# Patient Record
Sex: Female | Born: 1985
Health system: Southern US, Community
[De-identification: ages and names within clinical notes are randomized; demographics above are authoritative.]

## PROBLEM LIST (undated history)

## (undated) DIAGNOSIS — E669 Obesity, unspecified: Secondary | ICD-10-CM

## (undated) DIAGNOSIS — R062 Wheezing: Secondary | ICD-10-CM

## (undated) DIAGNOSIS — T7840XA Allergy, unspecified, initial encounter: Secondary | ICD-10-CM

## (undated) DIAGNOSIS — Z8489 Family history of other specified conditions: Secondary | ICD-10-CM

## (undated) DIAGNOSIS — R0602 Shortness of breath: Secondary | ICD-10-CM

## (undated) DIAGNOSIS — A6000 Herpesviral infection of urogenital system, unspecified: Secondary | ICD-10-CM

## (undated) HISTORY — PX: NO PAST SURGERIES: SHX2092

## (undated) HISTORY — DX: Allergy, unspecified, initial encounter: T78.40XA

---

## 2012-05-01 ENCOUNTER — Ambulatory Visit (INDEPENDENT_AMBULATORY_CARE_PROVIDER_SITE_OTHER): Payer: 59 | Admitting: Emergency Medicine

## 2012-05-01 VITALS — BP 118/88 | HR 86 | Temp 98.6°F | Resp 16 | Ht 63.0 in | Wt 318.2 lb

## 2012-05-01 DIAGNOSIS — J309 Allergic rhinitis, unspecified: Secondary | ICD-10-CM | POA: Insufficient documentation

## 2012-05-01 MED ORDER — MOMETASONE FUROATE 50 MCG/ACT NA SUSP: 4.0000 | Freq: Every day | NASAL | Status: DC

## 2012-05-01 MED ORDER — FEXOFENADINE HCL 180 MG PO TABS: 180.0000 mg | ORAL_TABLET | Freq: Every day | ORAL | Status: DC

## 2012-05-01 MED ORDER — MONTELUKAST SODIUM 10 MG PO TABS: 10.0000 mg | ORAL_TABLET | Freq: Every day | ORAL | Status: DC

## 2012-05-01 MED ORDER — ALBUTEROL SULFATE HFA 108 (90 BASE) MCG/ACT IN AERS: 2.0000 | INHALATION_SPRAY | RESPIRATORY_TRACT | Status: DC | PRN

## 2012-05-01 NOTE — Patient Instructions (Addendum)
Allergic Rhinitis  Allergic rhinitis is when the mucous membranes in the nose respond to allergens. Allergens are particles in the air that cause your body to have an allergic reaction. This causes you to release allergic antibodies. Through a chain of events, these eventually cause you to release histamine into the blood stream (hence the use of antihistamines). Although meant to be protective to the body, it is this release that causes your discomfort, such as frequent sneezing, congestion and an itchy runny nose.    CAUSES    The pollen allergens may come from grasses, trees, and weeds. This is seasonal allergic rhinitis, or "hay fever." Other allergens cause year-round allergic rhinitis (perennial allergic rhinitis) such as house dust mite allergen, pet dander and mold spores.    SYMPTOMS     Nasal stuffiness (congestion).   Runny, itchy nose with sneezing and tearing of the eyes.   There is often an itching of the mouth, eyes and ears.  It cannot be cured, but it can be controlled with medications.  DIAGNOSIS    If you are unable to determine the offending allergen, skin or blood testing may find it.  TREATMENT     Avoid the allergen.   Medications and allergy shots (immunotherapy) can help.   Hay fever may often be treated with antihistamines in pill or nasal spray forms. Antihistamines block the effects of histamine. There are over-the-counter medicines that may help with nasal congestion and swelling around the eyes. Check with your caregiver before taking or giving this medicine.  If the treatment above does not work, there are many new medications your caregiver can prescribe. Stronger medications may be used if initial measures are ineffective. Desensitizing injections can be used if medications and avoidance fails. Desensitization is when a patient is given ongoing shots until the body becomes less sensitive to the allergen. Make sure you follow up with your caregiver if problems continue.   SEEK MEDICAL CARE IF:     You develop fever (more than 100.5 F (38.1 C).   You develop a cough that does not stop easily (persistent).   You have shortness of breath.   You start wheezing.   Symptoms interfere with normal daily activities.  Document Released: 09/26/2000 Document Revised: 03/26/2011 Document Reviewed: 04/07/2008  ExitCare Patient Information 2013 [employer].

## 2012-05-01 NOTE — Progress Notes (Signed)
[facility] [address], [place] [address] [phone]  Date:  05/01/2012   Name:  Erica Skinner   DOB:  23-Sep-1985   MRN:  [id number]  PCP:  No PCP Per Patient    Chief Complaint: Allergies and Medication Refill   History of Present Illness:  Erica Skinner is a 27 y.o. very pleasant female patient who presents with the following:  History of seasonal allergic rhinitis.  Is experiencing sever nasal and eye allergy symptoms this year.  Denies fever or chills, only intermittent cough with scant wheezing.  Nasal drainage is clear in color.  No improvement with over the counter medications or other home remedies. Denies other complaint or health concern today.   Patient Active Problem List  Diagnosis  . Allergic rhinitis, cause unspecified    Past Medical History  Diagnosis Date  . Allergy     No past surgical history on file.  History  Substance Use Topics  . Smoking status: Never Smoker   . Smokeless tobacco: Not on file  . Alcohol Use: Yes     Comment: socially    No family history on file.  No Known Allergies  Medication list has been reviewed and updated.  No current outpatient prescriptions on file prior to visit.   No current facility-administered medications on file prior to visit.    Review of Systems:  As per HPI, otherwise negative.    Physical Examination: Filed Vitals:   05/01/12 1028  BP: 118/88  Pulse: 86  Temp: 98.6 F (37 C)  Resp: 16   Filed Vitals:   05/01/12 1028  Height: 5\' 3"  (1.6 m)  Weight: 318 lb 3.2 oz (144.335 kg)   Body mass index is 56.38 kg/(m^2). Ideal Body Weight: Weight in (lb) to have BMI = 25: 140.8  GEN: WDWN, NAD, Non-toxic, A & O x 3 HEENT: Atraumatic, Normocephalic. Neck supple. No masses, No LAD. Ears and Nose: No external deformity. CV: RRR, No M/G/R. No JVD. No thrill. No extra heart sounds. PULM: CTA B, no wheezes, crackles, rhonchi. No retractions. No resp. distress.  No accessory muscle use. ABD: S, NT, ND, +BS. No rebound. No HSM. EXTR: No c/c/e NEURO Normal gait.  PSYCH: Normally interactive. Conversant. Not depressed or anxious appearing.  Calm demeanor.    Assessment and Plan: Seasonal allergic rhinitis Allergic bronchitis Allegra nasonex Albuterol singulair  Follow up as needed   Signed,  Phillips Odor, MD

## 2012-05-02 NOTE — Progress Notes (Signed)
Reviewed and agree.

## 2012-12-25 ENCOUNTER — Other Ambulatory Visit (HOSPITAL_COMMUNITY)
Admission: RE | Admit: 2012-12-25 | Discharge: 2012-12-25 | Disposition: A | Discharge status: Home / Self Care | Payer: 59 | Source: Ambulatory Visit | Attending: Family Medicine | Admitting: Family Medicine

## 2012-12-25 DIAGNOSIS — Z0000 Encounter for general adult medical examination without abnormal findings: Secondary | ICD-10-CM | POA: Insufficient documentation

## 2012-12-25 DIAGNOSIS — R87810 Cervical high risk human papillomavirus (HPV) DNA test positive: Secondary | ICD-10-CM | POA: Insufficient documentation

## 2012-12-25 DIAGNOSIS — Z1151 Encounter for screening for human papillomavirus (HPV): Secondary | ICD-10-CM | POA: Insufficient documentation

## 2013-08-22 ENCOUNTER — Emergency Department (HOSPITAL_COMMUNITY): Payer: 59

## 2013-08-22 ENCOUNTER — Emergency Department (HOSPITAL_COMMUNITY)
Admission: EM | Admit: 2013-08-22 | Discharge: 2013-08-22 | Disposition: A | Discharge status: Home / Self Care | Payer: 59 | Attending: Emergency Medicine | Admitting: Emergency Medicine

## 2013-08-22 ENCOUNTER — Encounter (HOSPITAL_COMMUNITY): Payer: Self-pay | Admitting: Emergency Medicine

## 2013-08-22 DIAGNOSIS — R000 Tachycardia, unspecified: Secondary | ICD-10-CM | POA: Insufficient documentation

## 2013-08-22 DIAGNOSIS — E669 Obesity, unspecified: Secondary | ICD-10-CM | POA: Insufficient documentation

## 2013-08-22 DIAGNOSIS — K5900 Constipation, unspecified: Secondary | ICD-10-CM | POA: Insufficient documentation

## 2013-08-22 DIAGNOSIS — K8050 Calculus of bile duct without cholangitis or cholecystitis without obstruction: Secondary | ICD-10-CM

## 2013-08-22 DIAGNOSIS — R1011 Right upper quadrant pain: Secondary | ICD-10-CM | POA: Diagnosis present

## 2013-08-22 DIAGNOSIS — Z3202 Encounter for pregnancy test, result negative: Secondary | ICD-10-CM | POA: Insufficient documentation

## 2013-08-22 DIAGNOSIS — K8020 Calculus of gallbladder without cholecystitis without obstruction: Secondary | ICD-10-CM | POA: Diagnosis not present

## 2013-08-22 HISTORY — DX: Obesity, unspecified: E66.9

## 2013-08-22 LAB — CBC WITH DIFFERENTIAL/PLATELET
BASOS ABS: 0 10*3/uL (ref 0.0–0.1)
Basophils Relative: 0 % (ref 0–1)
Eosinophils Absolute: 0 10*3/uL (ref 0.0–0.7)
Eosinophils Relative: 0 % (ref 0–5)
HCT: 35.3 % — ABNORMAL LOW (ref 36.0–46.0)
HEMOGLOBIN: 11.8 g/dL — AB (ref 12.0–15.0)
Lymphocytes Relative: 18 % (ref 12–46)
Lymphs Abs: 2.8 10*3/uL (ref 0.7–4.0)
MCH: 28.9 pg (ref 26.0–34.0)
MCHC: 33.4 g/dL (ref 30.0–36.0)
MCV: 86.5 fL (ref 78.0–100.0)
MONOS PCT: 7 % (ref 3–12)
Monocytes Absolute: 1 10*3/uL (ref 0.1–1.0)
NEUTROS ABS: 11.6 10*3/uL — AB (ref 1.7–7.7)
NEUTROS PCT: 75 % (ref 43–77)
Platelets: 446 10*3/uL — ABNORMAL HIGH (ref 150–400)
RBC: 4.08 MIL/uL (ref 3.87–5.11)
RDW: 12.7 % (ref 11.5–15.5)
WBC: 15.4 10*3/uL — AB (ref 4.0–10.5)

## 2013-08-22 LAB — URINALYSIS, ROUTINE W REFLEX MICROSCOPIC
Bilirubin Urine: NEGATIVE
Glucose, UA: NEGATIVE mg/dL
Ketones, ur: NEGATIVE mg/dL
NITRITE: NEGATIVE
PH: 7.5 (ref 5.0–8.0)
Protein, ur: 30 mg/dL — AB
Specific Gravity, Urine: 1.019 (ref 1.005–1.030)
Urobilinogen, UA: 0.2 mg/dL (ref 0.0–1.0)

## 2013-08-22 LAB — COMPREHENSIVE METABOLIC PANEL
ALK PHOS: 122 U/L — AB (ref 39–117)
ALT: 12 U/L (ref 0–35)
AST: 15 U/L (ref 0–37)
Albumin: 3.6 g/dL (ref 3.5–5.2)
Anion gap: 14 (ref 5–15)
BILIRUBIN TOTAL: 0.5 mg/dL (ref 0.3–1.2)
BUN: 6 mg/dL (ref 6–23)
CHLORIDE: 99 meq/L (ref 96–112)
CO2: 25 mEq/L (ref 19–32)
Calcium: 9.3 mg/dL (ref 8.4–10.5)
Creatinine, Ser: 0.57 mg/dL (ref 0.50–1.10)
GFR calc Af Amer: >90 mL/min (ref >90)
GFR calc non Af Amer: >90 mL/min (ref >90)
Glucose, Bld: 117 mg/dL — ABNORMAL HIGH (ref 70–99)
POTASSIUM: 3.6 meq/L — AB (ref 3.7–5.3)
Sodium: 138 mEq/L (ref 137–147)
Total Protein: 7.7 g/dL (ref 6.0–8.3)

## 2013-08-22 LAB — I-STAT TROPONIN, ED: Troponin i, poc: 0 ng/mL (ref 0.00–0.08)

## 2013-08-22 LAB — LIPASE, BLOOD: Lipase: 21 U/L (ref 11–59)

## 2013-08-22 LAB — URINE MICROSCOPIC-ADD ON

## 2013-08-22 LAB — PREGNANCY, URINE: Preg Test, Ur: NEGATIVE

## 2013-08-22 MED ORDER — HYDROMORPHONE HCL PF 1 MG/ML IJ SOLN
0.5000 mg | Freq: Once | INTRAMUSCULAR | Status: AC
  Administered 2013-08-22: 0.5 mg via INTRAMUSCULAR

## 2013-08-22 MED ORDER — OXYCODONE-ACETAMINOPHEN 5-325 MG PO TABS: 1.0000 | ORAL_TABLET | Freq: Four times a day (QID) | ORAL | Status: AC | PRN

## 2013-08-22 MED ORDER — SODIUM CHLORIDE 0.9 % IV BOLUS (SEPSIS): 1000.0000 mL | INTRAVENOUS | Status: DC

## 2013-08-22 MED ORDER — OXYCODONE-ACETAMINOPHEN 5-325 MG PO TABS
1.0000 | ORAL_TABLET | Freq: Once | ORAL | Status: AC
  Administered 2013-08-22: 1 via ORAL
  Filled 2013-08-22: qty 1

## 2013-08-22 MED ORDER — ONDANSETRON 4 MG PO TBDP: ORAL_TABLET | ORAL | Status: DC

## 2013-08-22 MED ORDER — HYDROMORPHONE HCL PF 1 MG/ML IJ SOLN
0.5000 mg | Freq: Once | INTRAMUSCULAR | Status: DC
  Filled 2013-08-22: qty 1

## 2013-08-22 NOTE — ED Notes (Signed)
Pt A&OX4, ambulatory at d/c with steady gait, NAD 

## 2013-08-22 NOTE — ED Notes (Signed)
Pt. reports epigastric pain " knot" onset yesterday with emesis x2 and constipation for several days , pain worse with deep inspiration , denies nausea or fever .

## 2013-08-22 NOTE — ED Notes (Signed)
Dr. Harrison at the bedside. 

## 2013-08-22 NOTE — ED Notes (Signed)
Pt can have the Dilaudid IM instead of IV per Dr. Romeo AppleHarrison

## 2013-08-22 NOTE — Discharge Instructions (Signed)
Biliary Colic  °Biliary colic is a steady or irregular pain in the upper abdomen. It is usually under the right side of the rib cage. It happens when gallstones interfere with the normal flow of bile from the gallbladder. Bile is a liquid that helps to digest fats. Bile is made in the liver and stored in the gallbladder. When you eat a meal, bile passes from the gallbladder through the cystic duct and the common bile duct into the small intestine. There, it mixes with partially digested food. If a gallstone blocks either of these ducts, the normal flow of bile is blocked. The muscle cells in the bile duct contract forcefully to try to move the stone. This causes the pain of biliary colic.  °SYMPTOMS  °· A person with biliary colic usually complains of pain in the upper abdomen. This pain can be: °¨ In the center of the upper abdomen just below the breastbone. °¨ In the upper-right part of the abdomen, near the gallbladder and liver. °¨ Spread back toward the right shoulder blade. °· Nausea and vomiting. °· The pain usually occurs after eating. °· Biliary colic is usually triggered by the digestive system's demand for bile. The demand for bile is high after fatty meals. Symptoms can also occur when a person who has been fasting suddenly eats a very large meal. Most episodes of biliary colic pass after 1 to 5 hours. After the most intense pain passes, your abdomen may continue to ache mildly for about 24 hours. °DIAGNOSIS  °After you describe your symptoms, your caregiver will perform a physical exam. He or she will pay attention to the upper right portion of your belly (abdomen). This is the area of your liver and gallbladder. An ultrasound will help your caregiver look for gallstones. Specialized scans of the gallbladder may also be done. Blood tests may be done, especially if you have fever or if your pain persists. °PREVENTION  °Biliary colic can be prevented by controlling the risk factors for gallstones. Some of  these risk factors, such as heredity, increasing age, and pregnancy are a normal part of life. Obesity and a high-fat diet are risk factors you can change through a healthy lifestyle. Women going through menopause who take hormone replacement therapy (estrogen) are also more likely to develop biliary colic. °TREATMENT  °· Pain medication may be prescribed. °· You may be encouraged to eat a fat-free diet. °· If the first episode of biliary colic is severe, or episodes of colic keep retuning, surgery to remove the gallbladder (cholecystectomy) is usually recommended. This procedure can be done through small incisions using an instrument called a laparoscope. The procedure often requires a brief stay in the hospital. Some people can leave the hospital the same day. It is the most widely used treatment in people troubled by painful gallstones. It is effective and safe, with no complications in more than 90% of cases. °· If surgery cannot be done, medication that dissolves gallstones may be used. This medication is expensive and can take months or years to work. Only small stones will dissolve. °· Rarely, medication to dissolve gallstones is combined with a procedure called shock-wave lithotripsy. This procedure uses carefully aimed shock waves to break up gallstones. In many people treated with this procedure, gallstones form again within a few years. °PROGNOSIS  °If gallstones block your cystic duct or common bile duct, you are at risk for repeated episodes of biliary colic. There is also a 25% chance that you will develop   a gallbladder infection(acute cholecystitis), or some other complication of gallstones within 10 to 20 years. If you have surgery, schedule it at a time that is convenient for you and at a time when you are not sick. °HOME CARE INSTRUCTIONS  °· Drink plenty of clear fluids. °· Avoid fatty, greasy or fried foods, or any foods that make your pain worse. °· Take medications as directed. °SEEK MEDICAL  CARE IF:  °· You develop a fever over 100.5° F (38.1° C). °· Your pain gets worse over time. °· You develop nausea that prevents you from eating and drinking. °· You develop vomiting. °SEEK IMMEDIATE MEDICAL CARE IF:  °· You have continuous or severe belly (abdominal) pain which is not relieved with medications. °· You develop nausea and vomiting which is not relieved with medications. °· You have symptoms of biliary colic and you suddenly develop a fever and shaking chills. This may signal cholecystitis. Call your caregiver immediately. °· You develop a yellow color to your skin or the white part of your eyes (jaundice). °Document Released: 06/04/2005 Document Revised: 03/26/2011 Document Reviewed: 08/14/2007 °ExitCare® Patient Information ©2015 [employer]. This information is not intended to replace advice given to you by your health care provider. Make sure you discuss any questions you have with your health care provider. ° °

## 2013-08-22 NOTE — ED Provider Notes (Signed)
CSN: [id number]     Arrival date & time 08/22/13  1945 History   First MD Initiated Contact with Patient 08/22/13 2017     Chief Complaint  Patient presents with  . Abdominal Pain     (Consider location/radiation/quality/duration/timing/severity/associated sxs/prior Treatment) Patient is a 27 y.o. female presenting with abdominal pain. The history is provided by the patient.  Abdominal Pain Pain location:  RUQ Pain quality: squeezing   Pain radiates to:  Does not radiate Pain severity:  Moderate Onset quality:  Sudden Duration:  1 day Timing:  Constant Progression:  Waxing and waning Chronicity:  Recurrent Context comment:  After eating Relieved by:  Nothing Exacerbated by: movement, certain positions. Ineffective treatments: immodium. Associated symptoms: constipation, nausea and vomiting   Associated symptoms: no chest pain, no cough, no diarrhea, no dysuria, no fatigue, no fever, no hematuria and no shortness of breath     Past Medical History  Diagnosis Date  . Allergy   . Obesity    History reviewed. No pertinent past surgical history. No family history on file. History  Substance Use Topics  . Smoking status: Never Smoker   . Smokeless tobacco: Not on file  . Alcohol Use: Yes     Comment: socially   OB History   Grav Para Term Preterm Abortions TAB SAB Ect Mult Living                 Review of Systems  Constitutional: Negative for fever and fatigue.  HENT: Negative for congestion and drooling.   Eyes: Negative for pain.  Respiratory: Negative for cough and shortness of breath.   Cardiovascular: Negative for chest pain.  Gastrointestinal: Positive for nausea, vomiting, abdominal pain and constipation. Negative for diarrhea.  Genitourinary: Negative for dysuria and hematuria.  Musculoskeletal: Negative for back pain, gait problem and neck pain.  Skin: Negative for color change.  Neurological: Negative for dizziness and headaches.  Hematological: Negative  for adenopathy.  Psychiatric/Behavioral: Negative for behavioral problems.  All other systems reviewed and are negative.     Allergies  Review of patient's allergies indicates no known allergies.  Home Medications   Prior to Admission medications   Medication Sig Start Date End Date Taking? Authorizing Provider  albuterol (PROVENTIL HFA;VENTOLIN HFA) 108 (90 BASE) MCG/ACT inhaler Inhale 1-2 puffs into the lungs every 6 (six) hours as needed for wheezing or shortness of breath.   Yes Historical Provider, MD   BP 134/70  Pulse 111  Temp(Src) 98.5 F (36.9 C) (Oral)  Resp 18  SpO2 98%  LMP 08/14/2013 Physical Exam  Nursing note and vitals reviewed. Constitutional: She is oriented to person, place, and time. She appears well-developed and well-nourished.  HENT:  Head: Normocephalic.  Mouth/Throat: Oropharynx is clear and moist. No oropharyngeal exudate.  Eyes: Conjunctivae and EOM are normal. Pupils are equal, round, and reactive to light.  Neck: Normal range of motion. Neck supple.  Cardiovascular: Normal rate, regular rhythm, normal heart sounds and intact distal pulses.  Exam reveals no gallop and no friction rub.   No murmur heard. Pulmonary/Chest: Effort normal and breath sounds normal. No respiratory distress. She has no wheezes.  Abdominal: Soft. Bowel sounds are normal. There is tenderness (mild ttp of RUQ and epig area). There is no rebound and no guarding.  Musculoskeletal: Normal range of motion. She exhibits no edema and no tenderness.  Neurological: She is alert and oriented to person, place, and time.  Skin: Skin is warm and dry.  Psychiatric: She  has a normal mood and affect. Her behavior is normal.    ED Course  Procedures (including critical care time) Labs Review Labs Reviewed  CBC WITH DIFFERENTIAL - Abnormal; Notable for the following:    WBC 15.4 (*)    Hemoglobin 11.8 (*)    HCT 35.3 (*)    Platelets 446 (*)    Neutro Abs 11.6 (*)    All other  components within normal limits  COMPREHENSIVE METABOLIC PANEL - Abnormal; Notable for the following:    Potassium 3.6 (*)    Glucose, Bld 117 (*)    Alkaline Phosphatase 122 (*)    All other components within normal limits  URINALYSIS, ROUTINE W REFLEX MICROSCOPIC - Abnormal; Notable for the following:    APPearance CLOUDY (*)    Hgb urine dipstick LARGE (*)    Protein, ur 30 (*)    Leukocytes, UA SMALL (*)    All other components within normal limits  URINE MICROSCOPIC-ADD ON - Abnormal; Notable for the following:    Squamous Epithelial / LPF FEW (*)    Bacteria, UA FEW (*)    Casts HYALINE CASTS (*)    All other components within normal limits  LIPASE, BLOOD  PREGNANCY, URINE  I-STAT TROPOININ, ED    Imaging Review [place] Abdomen Limited Ruq  08/22/2013   CLINICAL DATA:  Epigastric, right upper quadrant pain.  EXAM: US ABDOMEN LIMITED - RIGHT UPPER QUADRANT  COMPARISON:  None.  FINDINGS: Gallbladder:  Gallbladder sludge with multiple mobile stones seen within the gallbladder lumen, the largest of which measured approximately 1.4 cm. No free pericholecystic fluid. Gallbladder wall within normal limits at 2 mm. Sonographic Eulah PontMurphy sign was absent.  Common bile duct:  Diameter: 4.1 mm  Liver:  No focal lesion identified. Mildly heterogeneous echotexture seen within the liver parenchyma, which may be in part related to technique.  IMPRESSION: Cholelithiasis with gallbladder sludging. No sonographic evidence for acute cholecystitis or biliary dilatation.   Electronically Signed   By: Rise MuBenjamin  McClintock M.D.   On: 08/22/2013 21:59     EKG Interpretation None      MDM   Final diagnoses:  Biliary colic    8:29 PM 28 y.o. female who presents with right upper quadrant and epigastric pain which began yesterday after eating and has persisted. She has a history of similar symptoms and has them approximately once a month. She can sometimes go for a few months without symptoms. She's had some  nausea and vomiting but denies any diarrhea. Her last bowel movement was several days ago. She is afebrile and mildly tachycardic here. She denies any fevers. Her abdomen is soft and benign. Will get screening labs, imaging, pain control with Dilaudid.  Pt refused IV, but was tolerating po. She did not get the IVF I ordered d/t this.   11:32 PM: Found to have gallstones/sludge. Likely biliary colic. Normal bili and lft's. Pain controlled, she is tolerating po.  I have discussed the diagnosis/risks/treatment options with the patient and believe the pt to be eligible for discharge home to follow-up with [facility] as an outpt. We also discussed returning to the ED immediately if new or worsening sx occur. We discussed the sx which are most concerning (e.g., worsening pain, fever, jaundice) that necessitate immediate return. Medications administered to the patient during their visit and any new prescriptions provided to the patient are listed below.  Medications given during this visit Medications  HYDROmorphone (DILAUDID) injection 0.5 mg (0.5 mg Intramuscular Given 08/22/13  2223)  oxyCODONE-acetaminophen (PERCOCET/ROXICET) 5-325 MG per tablet 1 tablet (1 tablet Oral Given 08/22/13 2310)    Discharge Medication List as of 08/22/2013 11:34 PM    START taking these medications   Details  ondansetron (ZOFRAN ODT) 4 MG disintegrating tablet 4mg  ODT q4 hours prn nausea/vomit, Print    oxyCODONE-acetaminophen (PERCOCET) 5-325 MG per tablet Take 1-2 tablets by mouth every 6 (six) hours as needed for moderate pain., Starting 08/22/2013, Until Discontinued, Print         Purvis Sheffield, MD 08/23/13 1151

## 2013-08-22 NOTE — ED Notes (Signed)
The patient is unable to give specimen at this time. The patient has been advised to use call light for assistance to the restroom. The tech has reported to the RN in charge.

## 2013-08-22 NOTE — ED Notes (Signed)
Patient transported to Ultrasound 

## 2013-08-22 NOTE — ED Notes (Signed)
IV attempt x 2, pt doesn't want IV at this time.  Wants pain med another route. MD aware.

## 2013-08-25 ENCOUNTER — Ambulatory Visit (INDEPENDENT_AMBULATORY_CARE_PROVIDER_SITE_OTHER): Payer: 59 | Admitting: General Surgery

## 2013-08-25 ENCOUNTER — Encounter (INDEPENDENT_AMBULATORY_CARE_PROVIDER_SITE_OTHER): Payer: Self-pay | Admitting: General Surgery

## 2013-08-25 VITALS — BP 116/74 | HR 90 | Temp 97.9°F | Ht 64.0 in | Wt 323.0 lb

## 2013-08-25 DIAGNOSIS — K8020 Calculus of gallbladder without cholecystitis without obstruction: Secondary | ICD-10-CM

## 2013-08-25 NOTE — Progress Notes (Signed)
Patient ID: Erica PolingCandice Skinner, female   DOB: 10/31/1985, 28 y.o.   MRN: [id number]  Chief Complaint  Patient presents with  . Abdominal Pain    HPI Erica Orvan FalconerCampbell is a 28 y.o. female.  The patient is a 28 year old black female who has been experiencing intermittent epigastric pain for the last 5 years. This past Friday she developed a severe episode of pain in her epigastric region. The pain has not let up since then. She has been expensing significant nausea and vomiting. The pain usually lasts a couple hours before subsiding. She did have a recent ultrasound that showed stones in her gallbladder but no gallbladder wall thickening or ductal dilatation. Her liver functions were normal.  HPI  Past Medical History  Diagnosis Date  . Allergy   . Obesity     History reviewed. No pertinent past surgical history.  History reviewed. No pertinent family history.  Social History History  Substance Use Topics  . Smoking status: Never Smoker   . Smokeless tobacco: Not on file  . Alcohol Use: Yes     Comment: socially    No Known Allergies  Current Outpatient Prescriptions  Medication Sig Dispense Refill  . albuterol (PROVENTIL HFA;VENTOLIN HFA) 108 (90 BASE) MCG/ACT inhaler Inhale 1-2 puffs into the lungs every 6 (six) hours as needed for wheezing or shortness of breath.      . ondansetron (ZOFRAN ODT) 4 MG disintegrating tablet 4mg  ODT q4 hours prn nausea/vomit  20 tablet  0  . oxyCODONE-acetaminophen (PERCOCET) 5-325 MG per tablet Take 1-2 tablets by mouth every 6 (six) hours as needed for moderate pain.  20 tablet  0   No current facility-administered medications for this visit.    Review of Systems Review of Systems  Constitutional: Negative.   HENT: Negative.   Eyes: Negative.   Respiratory: Negative.   Cardiovascular: Negative.   Gastrointestinal: Negative.   Endocrine: Negative.   Genitourinary: Negative.   Musculoskeletal: Negative.   Skin: Negative.    Allergic/Immunologic: Negative.   Neurological: Negative.   Hematological: Negative.   Psychiatric/Behavioral: Negative.     Blood pressure 116/74, pulse 90, temperature 97.9 F (36.6 C), temperature source Oral, height 5\' 4"  (1.626 m), weight 323 lb (146.512 kg), last menstrual period 08/14/2013, SpO2 97.00%.  Physical Exam Physical Exam  Constitutional: She is oriented to person, place, and time. She appears well-developed and well-nourished.  HENT:  Head: Normocephalic and atraumatic.  Eyes: Conjunctivae and EOM are normal. Pupils are equal, round, and reactive to light.  Neck: Normal range of motion. Neck supple.  Cardiovascular: Normal rate, regular rhythm and normal heart sounds.   Pulmonary/Chest: Effort normal and breath sounds normal.  Abdominal: Soft. Bowel sounds are normal. There is tenderness.  Musculoskeletal: Normal range of motion.  Lymphadenopathy:    She has no cervical adenopathy.  Neurological: She is alert and oriented to person, place, and time.  Skin: Skin is warm and dry.  Psychiatric: She has a normal mood and affect. Her behavior is normal.    Data Reviewed As above  Assessment    The patient appears to have symptomatic gallstones. Because of the risk of further painful episodes and possible pancreatitis she would benefit from having her gallbladder removed. I've discussed with her in detail the risks and benefits of the operation to remove the gallbladder as well as some of the technical aspects and she understands and wishes to proceed     Plan    Plan for laparoscopic cholecystectomy with  intraoperative cholangiogram        TOTH III,PAUL S 08/25/2013, 12:06 PM

## 2013-08-25 NOTE — Patient Instructions (Signed)
Plan for lap chole with ioc 

## 2013-09-16 ENCOUNTER — Ambulatory Visit (HOSPITAL_BASED_OUTPATIENT_CLINIC_OR_DEPARTMENT_OTHER): Admit: 2013-09-16 | Payer: Self-pay | Admitting: General Surgery

## 2013-09-16 ENCOUNTER — Encounter (HOSPITAL_BASED_OUTPATIENT_CLINIC_OR_DEPARTMENT_OTHER): Payer: Self-pay

## 2013-09-16 SURGERY — RADIOACTIVE SEED GUIDED PARTIAL MASTECTOMY WITH AXILLARY SENTINEL LYMPH NODE BIOPSY
Anesthesia: General | Site: Breast | Laterality: Right

## 2013-09-24 ENCOUNTER — Encounter (HOSPITAL_COMMUNITY): Payer: Self-pay | Admitting: Pharmacy Technician

## 2013-09-25 ENCOUNTER — Encounter (HOSPITAL_COMMUNITY): Payer: Self-pay | Admitting: *Deleted

## 2013-09-25 NOTE — Progress Notes (Signed)
Erica Skinner states she is not a Jehovah's witness now, but continues to refuse Blood Transfusion.

## 2013-09-27 MED ORDER — CHLORHEXIDINE GLUCONATE 4 % EX LIQD
1.0000 "application " | Freq: Once | CUTANEOUS | Status: DC
  Filled 2013-09-27: qty 15

## 2013-09-27 MED ORDER — DEXTROSE 5 % IV SOLN
3.0000 g | INTRAVENOUS | Status: AC
  Administered 2013-09-28: 3 g via INTRAVENOUS
  Filled 2013-09-27: qty 3000

## 2013-09-28 ENCOUNTER — Ambulatory Visit (HOSPITAL_COMMUNITY): Payer: 59 | Admitting: Anesthesiology

## 2013-09-28 ENCOUNTER — Ambulatory Visit (HOSPITAL_COMMUNITY): Payer: 59

## 2013-09-28 ENCOUNTER — Ambulatory Visit (HOSPITAL_COMMUNITY)
Admission: RE | Admit: 2013-09-28 | Discharge: 2013-09-28 | Disposition: A | Discharge status: Home / Self Care | Payer: 59 | Source: Ambulatory Visit | Attending: General Surgery | Admitting: General Surgery

## 2013-09-28 ENCOUNTER — Encounter (HOSPITAL_COMMUNITY): Payer: Self-pay | Admitting: *Deleted

## 2013-09-28 ENCOUNTER — Encounter (HOSPITAL_COMMUNITY)
Admission: RE | Disposition: A | Discharge status: Home / Self Care | Payer: Self-pay | Source: Ambulatory Visit | Attending: General Surgery

## 2013-09-28 ENCOUNTER — Encounter (HOSPITAL_COMMUNITY): Payer: 59 | Admitting: Anesthesiology

## 2013-09-28 DIAGNOSIS — K219 Gastro-esophageal reflux disease without esophagitis: Secondary | ICD-10-CM | POA: Diagnosis not present

## 2013-09-28 DIAGNOSIS — K8010 Calculus of gallbladder with chronic cholecystitis without obstruction: Secondary | ICD-10-CM | POA: Insufficient documentation

## 2013-09-28 DIAGNOSIS — K8020 Calculus of gallbladder without cholecystitis without obstruction: Secondary | ICD-10-CM | POA: Diagnosis present

## 2013-09-28 DIAGNOSIS — J45909 Unspecified asthma, uncomplicated: Secondary | ICD-10-CM | POA: Diagnosis not present

## 2013-09-28 DIAGNOSIS — Z6841 Body Mass Index (BMI) 40.0 and over, adult: Secondary | ICD-10-CM | POA: Insufficient documentation

## 2013-09-28 HISTORY — DX: Shortness of breath: R06.02

## 2013-09-28 HISTORY — DX: Family history of other specified conditions: Z84.89

## 2013-09-28 HISTORY — DX: Herpesviral infection of urogenital system, unspecified: A60.00

## 2013-09-28 HISTORY — DX: Wheezing: R06.2

## 2013-09-28 HISTORY — PX: CHOLECYSTECTOMY: SHX55

## 2013-09-28 LAB — BASIC METABOLIC PANEL
Anion gap: 14 (ref 5–15)
BUN: 9 mg/dL (ref 6–23)
CO2: 24 mEq/L (ref 19–32)
CREATININE: 0.65 mg/dL (ref 0.50–1.10)
Calcium: 9.2 mg/dL (ref 8.4–10.5)
Chloride: 101 mEq/L (ref 96–112)
GLUCOSE: 117 mg/dL — AB (ref 70–99)
Potassium: 3.9 mEq/L (ref 3.7–5.3)
Sodium: 139 mEq/L (ref 137–147)

## 2013-09-28 LAB — CBC
HCT: 38.7 % (ref 36.0–46.0)
Hemoglobin: 13 g/dL (ref 12.0–15.0)
MCH: 29 pg (ref 26.0–34.0)
MCHC: 33.6 g/dL (ref 30.0–36.0)
MCV: 86.2 fL (ref 78.0–100.0)
PLATELETS: 443 10*3/uL — AB (ref 150–400)
RBC: 4.49 MIL/uL (ref 3.87–5.11)
RDW: 13 % (ref 11.5–15.5)
WBC: 11 10*3/uL — ABNORMAL HIGH (ref 4.0–10.5)

## 2013-09-28 LAB — HCG, SERUM, QUALITATIVE: PREG SERUM: NEGATIVE

## 2013-09-28 LAB — NO BLOOD PRODUCTS

## 2013-09-28 SURGERY — LAPAROSCOPIC CHOLECYSTECTOMY WITH INTRAOPERATIVE CHOLANGIOGRAM
Anesthesia: General | Site: Abdomen

## 2013-09-28 MED ORDER — OXYCODONE HCL 5 MG PO TABS
ORAL_TABLET | ORAL | Status: AC
  Filled 2013-09-28: qty 1

## 2013-09-28 MED ORDER — LIDOCAINE HCL (CARDIAC) 20 MG/ML IV SOLN
INTRAVENOUS | Status: DC | PRN
  Administered 2013-09-28: 80 mg via INTRAVENOUS

## 2013-09-28 MED ORDER — ROCURONIUM BROMIDE 100 MG/10ML IV SOLN
INTRAVENOUS | Status: DC | PRN
  Administered 2013-09-28: 10 mg via INTRAVENOUS
  Administered 2013-09-28: 25 mg via INTRAVENOUS

## 2013-09-28 MED ORDER — PROMETHAZINE HCL 25 MG/ML IJ SOLN
INTRAMUSCULAR | Status: AC
  Filled 2013-09-28: qty 1

## 2013-09-28 MED ORDER — OXYCODONE-ACETAMINOPHEN 5-325 MG PO TABS: 1.0000 | ORAL_TABLET | ORAL | Status: AC | PRN

## 2013-09-28 MED ORDER — FENTANYL CITRATE 0.05 MG/ML IJ SOLN
INTRAMUSCULAR | Status: AC
  Filled 2013-09-28: qty 5

## 2013-09-28 MED ORDER — 0.9 % SODIUM CHLORIDE (POUR BTL) OPTIME
TOPICAL | Status: DC | PRN
  Administered 2013-09-28: 1000 mL

## 2013-09-28 MED ORDER — PROPOFOL 10 MG/ML IV BOLUS
INTRAVENOUS | Status: AC
  Filled 2013-09-28: qty 20

## 2013-09-28 MED ORDER — BUPIVACAINE-EPINEPHRINE 0.25% -1:200000 IJ SOLN
INTRAMUSCULAR | Status: DC | PRN
  Administered 2013-09-28: 28 mL

## 2013-09-28 MED ORDER — SUCCINYLCHOLINE CHLORIDE 20 MG/ML IJ SOLN
INTRAMUSCULAR | Status: DC | PRN
  Administered 2013-09-28: 100 mg via INTRAVENOUS

## 2013-09-28 MED ORDER — FENTANYL CITRATE 0.05 MG/ML IJ SOLN
INTRAMUSCULAR | Status: DC | PRN
  Administered 2013-09-28 (×2): 50 ug via INTRAVENOUS
  Administered 2013-09-28 (×3): 100 ug via INTRAVENOUS
  Administered 2013-09-28 (×2): 50 ug via INTRAVENOUS

## 2013-09-28 MED ORDER — DEXAMETHASONE SODIUM PHOSPHATE 10 MG/ML IJ SOLN
INTRAMUSCULAR | Status: DC | PRN
  Administered 2013-09-28: 10 mg via INTRAVENOUS

## 2013-09-28 MED ORDER — MIDAZOLAM HCL 2 MG/2ML IJ SOLN
INTRAMUSCULAR | Status: AC
  Filled 2013-09-28: qty 2

## 2013-09-28 MED ORDER — PROMETHAZINE HCL 25 MG/ML IJ SOLN
6.2500 mg | INTRAMUSCULAR | Status: DC | PRN
  Administered 2013-09-28: 6.25 mg via INTRAVENOUS

## 2013-09-28 MED ORDER — LACTATED RINGERS IV SOLN
INTRAVENOUS | Status: DC
  Administered 2013-09-28: 50 mL/h via INTRAVENOUS

## 2013-09-28 MED ORDER — SODIUM CHLORIDE 0.9 % IR SOLN
Status: DC | PRN
  Administered 2013-09-28: 1000 mL

## 2013-09-28 MED ORDER — BUPIVACAINE-EPINEPHRINE (PF) 0.25% -1:200000 IJ SOLN
INTRAMUSCULAR | Status: AC
  Filled 2013-09-28: qty 30

## 2013-09-28 MED ORDER — MIDAZOLAM HCL 2 MG/2ML IJ SOLN
0.5000 mg | Freq: Once | INTRAMUSCULAR | Status: AC | PRN
  Administered 2013-09-28: 0.5 mg via INTRAVENOUS

## 2013-09-28 MED ORDER — LACTATED RINGERS IV SOLN
INTRAVENOUS | Status: DC | PRN
  Administered 2013-09-28 (×2): via INTRAVENOUS

## 2013-09-28 MED ORDER — OXYCODONE HCL 5 MG/5ML PO SOLN: 5.0000 mg | Freq: Once | ORAL | Status: AC | PRN

## 2013-09-28 MED ORDER — MEPERIDINE HCL 25 MG/ML IJ SOLN: 6.2500 mg | INTRAMUSCULAR | Status: DC | PRN

## 2013-09-28 MED ORDER — SODIUM CHLORIDE 0.9 % IV SOLN
INTRAVENOUS | Status: DC | PRN
  Administered 2013-09-28: 10:00:00

## 2013-09-28 MED ORDER — LACTATED RINGERS IV SOLN: INTRAVENOUS | Status: DC | PRN

## 2013-09-28 MED ORDER — MIDAZOLAM HCL 2 MG/2ML IJ SOLN
INTRAMUSCULAR | Status: DC
Start: 2013-09-28 — End: 2013-09-28
  Filled 2013-09-28: qty 2

## 2013-09-28 MED ORDER — PROPOFOL 10 MG/ML IV BOLUS
INTRAVENOUS | Status: DC | PRN
  Administered 2013-09-28: 180 mg via INTRAVENOUS

## 2013-09-28 MED ORDER — LABETALOL HCL 5 MG/ML IV SOLN
INTRAVENOUS | Status: DC | PRN
  Administered 2013-09-28: 5 mg via INTRAVENOUS

## 2013-09-28 MED ORDER — HYDROMORPHONE HCL PF 1 MG/ML IJ SOLN
INTRAMUSCULAR | Status: AC
  Filled 2013-09-28: qty 1

## 2013-09-28 MED ORDER — SCOPOLAMINE 1 MG/3DAYS TD PT72
MEDICATED_PATCH | TRANSDERMAL | Status: DC | PRN
  Administered 2013-09-28: 1.5 mg via TRANSDERMAL

## 2013-09-28 MED ORDER — HYDROMORPHONE HCL PF 1 MG/ML IJ SOLN
0.2500 mg | INTRAMUSCULAR | Status: DC | PRN
  Administered 2013-09-28 (×4): 0.5 mg via INTRAVENOUS

## 2013-09-28 MED ORDER — DIPHENHYDRAMINE HCL 50 MG/ML IJ SOLN
INTRAMUSCULAR | Status: DC | PRN
  Administered 2013-09-28: 12.5 mg via INTRAVENOUS

## 2013-09-28 MED ORDER — ONDANSETRON HCL 4 MG/2ML IJ SOLN
INTRAMUSCULAR | Status: DC | PRN
  Administered 2013-09-28: 4 mg via INTRAVENOUS

## 2013-09-28 MED ORDER — GLYCOPYRROLATE 0.2 MG/ML IJ SOLN
INTRAMUSCULAR | Status: DC | PRN
  Administered 2013-09-28: 0.6 mg via INTRAVENOUS
  Administered 2013-09-28: 0.2 mg via INTRAVENOUS

## 2013-09-28 MED ORDER — NEOSTIGMINE METHYLSULFATE 10 MG/10ML IV SOLN
INTRAVENOUS | Status: DC | PRN
  Administered 2013-09-28: 4 mg via INTRAVENOUS
  Administered 2013-09-28: 1 mg via INTRAVENOUS

## 2013-09-28 MED ORDER — OXYCODONE HCL 5 MG PO TABS
5.0000 mg | ORAL_TABLET | Freq: Once | ORAL | Status: AC | PRN
  Administered 2013-09-28: 5 mg via ORAL

## 2013-09-28 SURGICAL SUPPLY — 47 items
APPLIER CLIP ROT 10 11.4 M/L (STAPLE) ×3
BLADE SURG ROTATE 9660 (MISCELLANEOUS) IMPLANT
CANISTER SUCTION 2500CC (MISCELLANEOUS) ×3 IMPLANT
CATH REDDICK CHOLANGI 4FR 50CM (CATHETERS) ×3 IMPLANT
CHLORAPREP W/TINT 26ML (MISCELLANEOUS) ×3 IMPLANT
CLIP APPLIE ROT 10 11.4 M/L (STAPLE) ×1 IMPLANT
COVER MAYO STAND STRL (DRAPES) ×3 IMPLANT
COVER SURGICAL LIGHT HANDLE (MISCELLANEOUS) ×3 IMPLANT
DECANTER SPIKE VIAL GLASS SM (MISCELLANEOUS) IMPLANT
DERMABOND ADVANCED (GAUZE/BANDAGES/DRESSINGS) ×2
DERMABOND ADVANCED .7 DNX12 (GAUZE/BANDAGES/DRESSINGS) ×1 IMPLANT
DRAPE C-ARM 42X72 X-RAY (DRAPES) ×3 IMPLANT
DRAPE UTILITY 15X26 W/TAPE STR (DRAPE) ×6 IMPLANT
ELECT REM PT RETURN 9FT ADLT (ELECTROSURGICAL) ×3
ELECTRODE REM PT RTRN 9FT ADLT (ELECTROSURGICAL) ×1 IMPLANT
ENDOLOOP SUT PDS II  0 18 (SUTURE) ×4
ENDOLOOP SUT PDS II 0 18 (SUTURE) ×2 IMPLANT
GLOVE BIO SURGEON STRL SZ7 (GLOVE) ×3 IMPLANT
GLOVE BIO SURGEON STRL SZ7.5 (GLOVE) ×6 IMPLANT
GLOVE BIOGEL PI IND STRL 7.0 (GLOVE) ×1 IMPLANT
GLOVE BIOGEL PI IND STRL 7.5 (GLOVE) ×2 IMPLANT
GLOVE BIOGEL PI IND STRL 8 (GLOVE) ×1 IMPLANT
GLOVE BIOGEL PI INDICATOR 7.0 (GLOVE) ×2
GLOVE BIOGEL PI INDICATOR 7.5 (GLOVE) ×4
GLOVE BIOGEL PI INDICATOR 8 (GLOVE) ×2
GLOVE ECLIPSE 7.5 STRL STRAW (GLOVE) ×3 IMPLANT
GOWN STRL REUS W/ TWL LRG LVL3 (GOWN DISPOSABLE) ×4 IMPLANT
GOWN STRL REUS W/TWL LRG LVL3 (GOWN DISPOSABLE) ×8
IV CATH 14GX2 1/4 (CATHETERS) ×3 IMPLANT
KIT BASIN OR (CUSTOM PROCEDURE TRAY) ×3 IMPLANT
KIT ROOM TURNOVER OR (KITS) ×3 IMPLANT
NS IRRIG 1000ML POUR BTL (IV SOLUTION) ×3 IMPLANT
PAD ARMBOARD 7.5X6 YLW CONV (MISCELLANEOUS) ×3 IMPLANT
POUCH SPECIMEN RETRIEVAL 10MM (ENDOMECHANICALS) ×3 IMPLANT
SCISSORS LAP 5X35 DISP (ENDOMECHANICALS) ×3 IMPLANT
SET IRRIG TUBING LAPAROSCOPIC (IRRIGATION / IRRIGATOR) ×3 IMPLANT
SLEEVE ENDOPATH XCEL 5M (ENDOMECHANICALS) ×3 IMPLANT
SPECIMEN JAR SMALL (MISCELLANEOUS) ×3 IMPLANT
SUT MNCRL AB 4-0 PS2 18 (SUTURE) ×6 IMPLANT
TOWEL OR 17X24 6PK STRL BLUE (TOWEL DISPOSABLE) IMPLANT
TOWEL OR 17X26 10 PK STRL BLUE (TOWEL DISPOSABLE) ×3 IMPLANT
TRAY LAPAROSCOPIC (CUSTOM PROCEDURE TRAY) ×3 IMPLANT
TROCAR 5M 150ML BLDLS (TROCAR) ×9 IMPLANT
TROCAR XCEL BLUNT TIP 100MML (ENDOMECHANICALS) ×3 IMPLANT
TROCAR XCEL NON-BLD 11X100MML (ENDOMECHANICALS) ×3 IMPLANT
TROCAR XCEL NON-BLD 5MMX100MML (ENDOMECHANICALS) ×3 IMPLANT
TUBING INSUFF HIGH FLOW RTP (TUBING) ×3 IMPLANT

## 2013-09-28 NOTE — H&P (Signed)
Erica Skinner  08/25/2013 11:30 AM   Office Visit  MRN:  [id number]   Description: 28 year old female  Provider: Chevis Pretty III, MD  Department: [facility]         Diagnoses      Gallstones    -  Primary      ICD-9-CM: 574.20 ICD-10-CM: K80.20             Reason for Visit      Abdominal Pain             Current Vitals Most recent update: 08/25/2013 11:35 AM by Gilmer Mor, CMA      BP Pulse Temp(Src) Ht Wt BMI      116/74 90 97.9 F (36.6 C) (Oral)  (1.626 m) 323 lb (146.512 kg) 55.42 kg/m2      SpO2 LMP              97% 08/14/2013                     Progress Notes      Chevis Pretty III, MD at 08/25/2013 12:06 PM      Status: Signed            Patient ID: Erica Skinner, female   DOB: May 18, 1985, 28 y.o.   MRN: [id number]    Chief Complaint   Patient presents with   .  Abdominal Pain        HPI Erica Skinner is a 28 y.o. female.  The patient is a 28 year old black female who has been experiencing intermittent epigastric pain for the last 5 years. This past Friday she developed a severe episode of pain in her epigastric region. The pain has not let up since then. She has been expensing significant nausea and vomiting. The pain usually lasts a couple hours before subsiding. She did have a recent ultrasound that showed stones in her gallbladder but no gallbladder wall thickening or ductal dilatation. Her liver functions were normal.  HPI    Past Medical History   Diagnosis  Date   .  Allergy     .  Obesity          History reviewed. No pertinent past surgical history.   History reviewed. No pertinent family history.   Social History History   Substance Use Topics   .  Smoking status:  Never Smoker    .  Smokeless tobacco:  Not on file   .  Alcohol Use:  Yes         Comment: socially        No Known Allergies    Current Outpatient Prescriptions   Medication  Sig  Dispense  Refill   .  albuterol (PROVENTIL HFA;VENTOLIN HFA)  108 (90 BASE) MCG/ACT inhaler  Inhale 1-2 puffs into the lungs every 6 (six) hours as needed for wheezing or shortness of breath.         .  ondansetron (ZOFRAN ODT) 4 MG disintegrating tablet   ODT q4 hours prn nausea/vomit   20 tablet   0   .  oxyCODONE-acetaminophen (PERCOCET) 5-325 MG per tablet  Take 1-2 tablets by mouth every 6 (six) hours as needed for moderate pain.   20 tablet   0       No current facility-administered medications for this visit.        Review of Systems Review of Systems  Constitutional: Negative.   HENT: Negative.  Eyes: Negative.   Respiratory: Negative.   Cardiovascular: Negative.   Gastrointestinal: Negative.   Endocrine: Negative.   Genitourinary: Negative.   Musculoskeletal: Negative.   Skin: Negative.   Allergic/Immunologic: Negative.   Neurological: Negative.   Hematological: Negative.   Psychiatric/Behavioral: Negative.       Blood pressure 116/74, pulse 90, temperature 97.9 F (36.6 C), temperature source Oral, height  (1.626 m), weight 323 lb (146.512 kg), last menstrual period 08/14/2013, SpO2 97.00%.   Physical Exam Physical Exam  Constitutional: She is oriented to person, place, and time. She appears well-developed and well-nourished.  HENT:   Head: Normocephalic and atraumatic.  Eyes: Conjunctivae and EOM are normal. Pupils are equal, round, and reactive to light.  Neck: Normal range of motion. Neck supple.  Cardiovascular: Normal rate, regular rhythm and normal heart sounds.   Pulmonary/Chest: Effort normal and breath sounds normal.  Abdominal: Soft. Bowel sounds are normal. There is tenderness.  Musculoskeletal: Normal range of motion.  Lymphadenopathy:    She has no cervical adenopathy.  Neurological: She is alert and oriented to person, place, and time.  Skin: Skin is warm and dry.  Psychiatric: She has a normal mood and affect. Her behavior is normal.      Data Reviewed As above   Assessment    The  patient appears to have symptomatic gallstones. Because of the risk of further painful episodes and possible pancreatitis she would benefit from having her gallbladder removed. I've discussed with her in detail the risks and benefits of the operation to remove the gallbladder as well as some of the technical aspects and she understands and wishes to proceed      Plan    Plan for laparoscopic cholecystectomy with intraoperative cholangiogram

## 2013-09-28 NOTE — Discharge Instructions (Signed)
What to eat: ° °For your first meals, you should eat lightly; only small meals initially.  If you do not have nausea, you may eat larger meals.  Avoid spicy, greasy and heavy food.   ° °General Anesthesia, Adult, Care After  °Refer to this sheet in the next few weeks. These instructions provide you with information on caring for yourself after your procedure. Your health care provider may also give you more specific instructions. Your treatment has been planned according to current medical practices, but problems sometimes occur. Call your health care provider if you have any problems or questions after your procedure.  °WHAT TO EXPECT AFTER THE PROCEDURE  °After the procedure, it is typical to experience:  °Sleepiness.  °Nausea and vomiting. °HOME CARE INSTRUCTIONS  °For the first 24 hours after general anesthesia:  °Have a responsible person with you.  °Do not drive a car. If you are alone, do not take public transportation.  °Do not drink alcohol.  °Do not take medicine that has not been prescribed by your health care provider.  °Do not sign important papers or make important decisions.  °You may resume a normal diet and activities as directed by your health care provider.  °Change bandages (dressings) as directed.  °If you have questions or problems that seem related to general anesthesia, call the hospital and ask for the anesthetist or anesthesiologist on call. °SEEK MEDICAL CARE IF:  °You have nausea and vomiting that continue the day after anesthesia.  °You develop a rash. °SEEK IMMEDIATE MEDICAL CARE IF:  °You have difficulty breathing.  °You have chest pain.  °You have any allergic problems. °Document Released: 04/09/2000 Document Revised: 09/03/2012 Document Reviewed: 07/17/2012  °ExitCare® Patient Information ©2014 [employer].  ° °Tissue Adhesive Wound Care  ° ° °Some cuts and wounds can be closed with tissue adhesive. Adhesive is like glue. It holds the skin together and helps a wound heal faster.  This adhesive goes away on its own as the wound heals.  °HOME CARE  °Showers are allowed. Do not soak the wound in water. Do not take baths, swim, or use hot tubs. Do not use soaps or creams on your wound.  °If a bandage (dressing) was put on, change it as often as told by your doctor.  °Keep the bandage dry.  °Do not scratch, pick, or rub the adhesive.  °Do not put tape over the adhesive. The adhesive could come off.  °Protect the wound from another injury.  °Protect the wound from sun and tanning beds.  °Only take medicine as told by your doctor.  °Keep all doctor visits as told. °GET HELP RIGHT AWAY IF:  °Your wound is red, puffy (swollen), hot, or tender.  °You get a rash after the glue is put on.  °You have more pain in the wound.  °You have a red streak going away from the wound.  °You have yellowish-white fluid (pus) coming from the wound.  °You have more bleeding.  °You have a fever.  °You have chills and start to shake.  °You notice a bad smell coming from the wound.  °Your wound or adhesive breaks open. °MAKE SURE YOU:  °Understand these instructions.  °Will watch your condition.  °Will get help right away if you are not doing well or get worse. °Document Released: 10/11/2007 Document Revised: 10/22/2012 Document Reviewed: 07/23/2012  °ExitCare® Patient Information ©2015 [employer]. This information is not intended to replace advice given to you by your health care provider.   Make sure you discuss any questions you have with your health care provider.  ° ° °

## 2013-09-28 NOTE — Interval H&P Note (Signed)
History and Physical Interval Note:  09/28/2013 8:40 AM  Erica Skinner  has presented today for surgery, with the diagnosis of gallstones  The various methods of treatment have been discussed with the patient and family. After consideration of risks, benefits and other options for treatment, the patient has consented to  Procedure(s): LAPAROSCOPIC CHOLECYSTECTOMY WITH INTRAOPERATIVE CHOLANGIOGRAM (N/A) as a surgical intervention .  The patient's history has been reviewed, patient examined, no change in status, stable for surgery.  I have reviewed the patient's chart and labs.  Questions were answered to the patient's satisfaction.     TOTH III,PAUL S

## 2013-09-28 NOTE — Op Note (Signed)
09/28/2013  10:30 AM  PATIENT:  Erica Skinner  28 y.o. female  PRE-OPERATIVE DIAGNOSIS:  gallstones  POST-OPERATIVE DIAGNOSIS:  gallstones  PROCEDURE:  Procedure(s): LAPAROSCOPIC CHOLECYSTECTOMY WITH INTRAOPERATIVE CHOLANGIOGRAM (N/A)  SURGEON:  Surgeon(s) and Role:    * Griselda Miner, MD - Primary    * Frederik Schmidt, MD - Assisting  PHYSICIAN ASSISTANT:   ASSISTANTS: Dr. Lindie Spruce   ANESTHESIA:   general  EBL:  Total I/O In: 1000 [I.V.:1000] Out: -   BLOOD ADMINISTERED:none  DRAINS: none   LOCAL MEDICATIONS USED:  MARCAINE     SPECIMEN:  Source of Specimen:  gallbladder  DISPOSITION OF SPECIMEN:  PATHOLOGY  COUNTS:  YES  TOURNIQUET:  * No tourniquets in log *  DICTATION: .Dragon Dictation @  Procedure: After informed consent was obtained the patient was brought to the operating room and placed in the supine position on the operating room table. After adequate induction of general anesthesia the patient's abdomen was prepped with ChloraPrep allowed to dry and draped in usual sterile manner. The area below the umbilicus was infiltrated with quarter percent  Marcaine. A small incision was made with a 15 blade knife. The incision was carried down through the subcutaneous tissue bluntly with a hemostat and [employer] retractors. The linea alba was identified. The linea alba was incised with a 15 blade knife and each side was grasped with Coker clamps. The preperitoneal space was then probed with a hemostat until the peritoneum was opened and access was gained to the abdominal cavity. A 0 Vicryl pursestring stitch was placed in the fascia surrounding the opening. A Hassan cannula was then placed through the opening and anchored in place with the previously placed Vicryl purse string stitch. The abdomen was insufflated with carbon dioxide without difficulty. A laparoscope was inserted through the [facility] cannula in the right upper quadrant was inspected. Next the epigastric  region was infiltrated with % Marcaine. A small incision was made with a 15 blade knife. A 10 mm port was placed bluntly through this incision into the abdominal cavity under direct vision. Next 2 sites were chosen laterally on the right side of the abdomen for placement of 5 mm ports. Each of these areas was infiltrated with quarter percent Marcaine. Small stab incisions were made with a 15 blade knife. 5 mm ports were then placed bluntly through these incisions into the abdominal cavity under direct vision without difficulty. A blunt grasper was placed through the lateralmost 5 mm port and used to grasp the dome of the gallbladder and elevated anteriorly and superiorly. Another blunt grasper was placed through the other 5 mm port and used to retract the body and neck of the gallbladder. A dissector was placed through the epigastric port and using the electrocautery the peritoneal reflection at the gallbladder neck was opened. Blunt dissection was then carried out in this area until the gallbladder neck-cystic duct junction was readily identified and a good window was created. A clip was placed on the gallbladder neck because of its size. A small  ductotomy was made just below the clip with laparoscopic scissors. A 14-gauge Angiocath was then placed through the anterior abdominal wall under direct vision. A Reddick cholangiogram catheter was then placed through the Angiocath and flushed. The catheter was then placed in the cystic duct and anchored in place with a clip. A cholangiogram was obtained that showed no filling defects good emptying into the duodenum an adequate length on the cystic duct. The anchoring clip and catheters  were then removed from the patient. An endoloop was  placed proximally on the cystic duct and on the gallbladder neck because of its size and the duct was divided between the 2 endoloops. Posterior to this the cystic artery was identified and again dissected bluntly in a circumferential  manner until a good window  was created. 2 clips were placed proximally and one distally on the artery and the artery was divided between the 2 sets of clips. Next a laparoscopic hook cautery device was used to separate the gallbladder from the liver bed. Prior to completely detaching the gallbladder from the liver bed the liver bed was inspected and several small bleeding points were coagulated with the electrocautery until the area was completely hemostatic. The gallbladder was then detached the rest of it from the liver bed without difficulty. A laparoscopic bag was inserted through the epigastric port. The gallbladder was placed within the bag and the bag was sealed. A laparoscope was then moved to the epigastric port. The gallbladder grasper was placed through the [facility] cannula and used to grasp the opening of the bag. The bag with the gallbladder was then removed with the [facility] cannula through the infraumbilical port without difficulty. The fascial defect was then closed with the previously placed Vicryl pursestring stitch as well as with another figure-of-eight 0 Vicryl stitch. The liver bed was inspected again and found to be hemostatic. The abdomen was irrigated with copious amounts of saline until the effluent was clear. The ports were then removed under direct vision without difficulty and were found to be hemostatic. The gas was allowed to escape. The skin incisions were all closed with interrupted 4-0 Monocryl subcuticular stitches. Dermabond dressings were applied. The patient tolerated the procedure well. At the end of the case all needle sponge and instrument counts were correct. The patient was then awakened and taken to recovery in stable condition  PLAN OF CARE: Discharge to home after PACU  PATIENT DISPOSITION:  PACU - hemodynamically stable.   Delay start of Pharmacological VTE agent (>24hrs) due to surgical blood loss or risk of bleeding: not applicable

## 2013-09-28 NOTE — Transfer of Care (Signed)
Immediate Anesthesia Transfer of Care Note  Patient: Erica Skinner  Procedure(s) Performed: Procedure(s): LAPAROSCOPIC CHOLECYSTECTOMY WITH INTRAOPERATIVE CHOLANGIOGRAM (N/A)  Patient Location: PACU  Anesthesia Type:General  Level of Consciousness: awake, alert , oriented, patient cooperative and responds to stimulation  Airway & Oxygen Therapy: Patient Spontanous Breathing and Patient connected to face mask oxygen  Post-op Assessment: Report given to PACU RN, Post -op Vital signs reviewed and stable and Patient moving all extremities X 4  Post vital signs: Reviewed and stable  Complications: No apparent anesthesia complications

## 2013-09-28 NOTE — Anesthesia Preprocedure Evaluation (Addendum)
Anesthesia Evaluation  Patient identified by MRN, date of birth, ID band Patient awake    Reviewed: Allergy & Precautions, H&P , NPO status , Patient's Chart, lab work & pertinent test results  History of Anesthesia Complications Negative for: history of anesthetic complications  Airway Mallampati: I TM Distance: >3 FB Neck ROM: Full    Dental  (+) Dental Advisory Given, Teeth Intact   Pulmonary asthma (no recent inhaler use) ,  breath sounds clear to auscultation  Pulmonary exam normal       Cardiovascular negative cardio ROS  Rhythm:Regular Rate:Normal     Neuro/Psych negative neurological ROS     GI/Hepatic GERD-  Medicated and Controlled,Emesis with cholecystitis   Endo/Other  Morbid obesity  Renal/GU negative Renal ROS     Musculoskeletal   Abdominal (+) + obese,   Peds  Hematology negative hematology ROS (+)   Anesthesia Other Findings   Reproductive/Obstetrics 08/22/13 preg test: NEG LMP 1 week ago                          Anesthesia Physical Anesthesia Plan  ASA: III  Anesthesia Plan: General   Post-op Pain Management:    Induction: Intravenous  Airway Management Planned: Oral ETT  Additional Equipment:   Intra-op Plan:   Post-operative Plan: Extubation in OR  Informed Consent: I have reviewed the patients History and Physical, chart, labs and discussed the procedure including the risks, benefits and alternatives for the proposed anesthesia with the patient or authorized representative who has indicated his/her understanding and acceptance.   Dental advisory given  Plan Discussed with: CRNA and Surgeon  Anesthesia Plan Comments: (Plan routine monitors, GETA)        Anesthesia Quick Evaluation

## 2013-09-29 NOTE — Anesthesia Postprocedure Evaluation (Signed)
  Anesthesia Post-op Note  Patient: Erica Skinner  Procedure(s) Performed: Procedure(s): LAPAROSCOPIC CHOLECYSTECTOMY WITH INTRAOPERATIVE CHOLANGIOGRAM (N/A)  Patient Location: PACU  Anesthesia Type:General  Level of Consciousness: awake, alert , oriented and patient cooperative  Airway and Oxygen Therapy: Patient Spontanous Breathing  Post-op Pain: mild  Post-op Assessment: Post-op Vital signs reviewed, Patient's Cardiovascular Status Stable, Respiratory Function Stable, Patent Airway, No signs of Nausea or vomiting and Pain level controlled  Post-op Vital Signs: Reviewed and stable  Last Vitals:  Filed Vitals:   09/28/13 1317  BP: 146/84  Pulse: 99  Temp:   Resp: 15    Complications: No apparent anesthesia complications (delayed note, pt seen in PACU prior to discharge)

## 2013-10-01 ENCOUNTER — Encounter (HOSPITAL_COMMUNITY): Payer: Self-pay | Admitting: General Surgery

## 2014-01-14 ENCOUNTER — Other Ambulatory Visit: Payer: Self-pay | Admitting: Family Medicine

## 2014-01-14 ENCOUNTER — Other Ambulatory Visit (HOSPITAL_COMMUNITY)
Admission: RE | Admit: 2014-01-14 | Discharge: 2014-01-14 | Disposition: A | Discharge status: Home / Self Care | Payer: 59 | Source: Ambulatory Visit | Attending: Family Medicine | Admitting: Family Medicine

## 2014-01-14 DIAGNOSIS — R87612 Low grade squamous intraepithelial lesion on cytologic smear of cervix (LGSIL): Secondary | ICD-10-CM | POA: Insufficient documentation

## 2014-01-19 LAB — CYTOLOGY - PAP

## 2015-03-18 IMAGING — RF DG CHOLANGIOGRAM OPERATIVE
1 series · 4 of 4 positions shown · non-contrast
Comparison: Abdominal ultrasound - 08/22/2013

CLINICAL DATA: History of gallstones. Intraoperative cholangiogram.

EXAM:
INTRAOPERATIVE CHOLANGIOGRAM
FLUOROSCOPY TIME:  Sixteen second

[Series 1: run · 4 of 105 frames shown]
[frame 16/105]
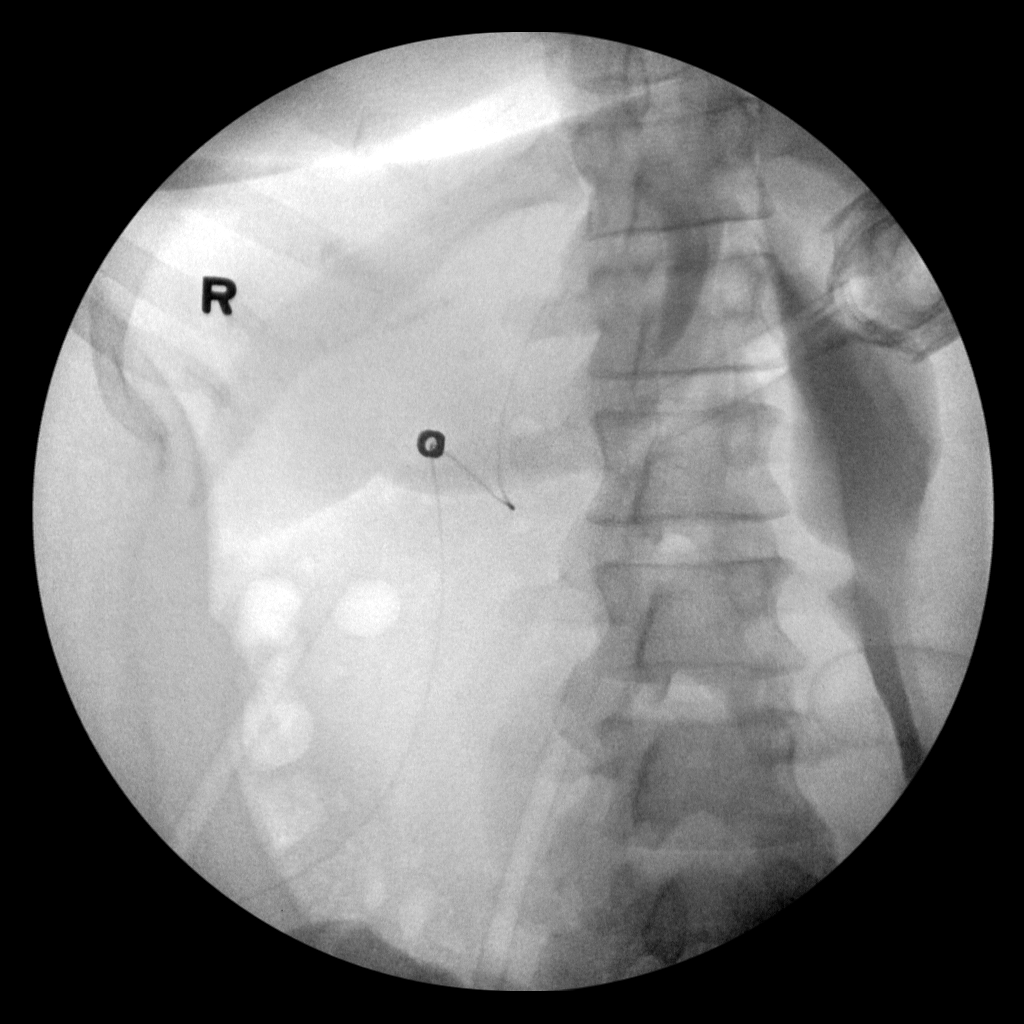
[frame 53/105]
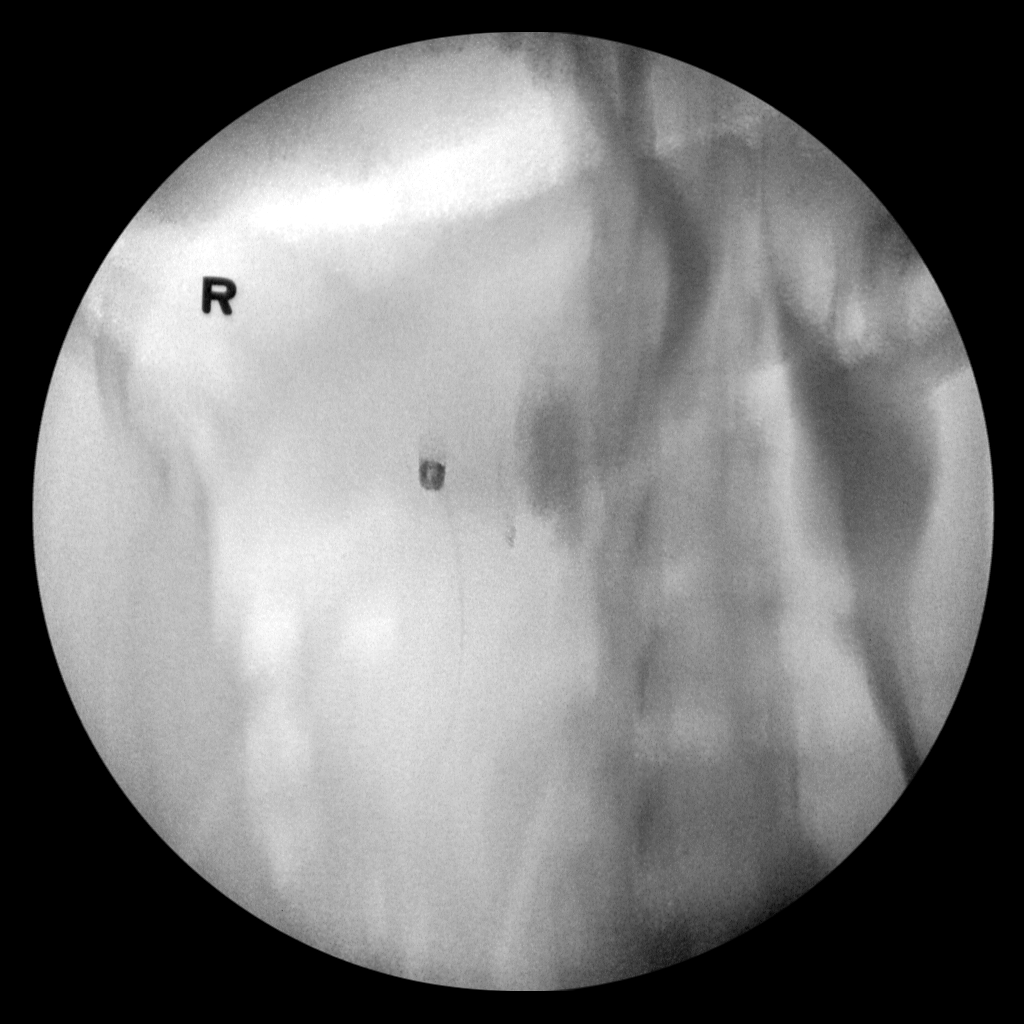
[frame 60/105]
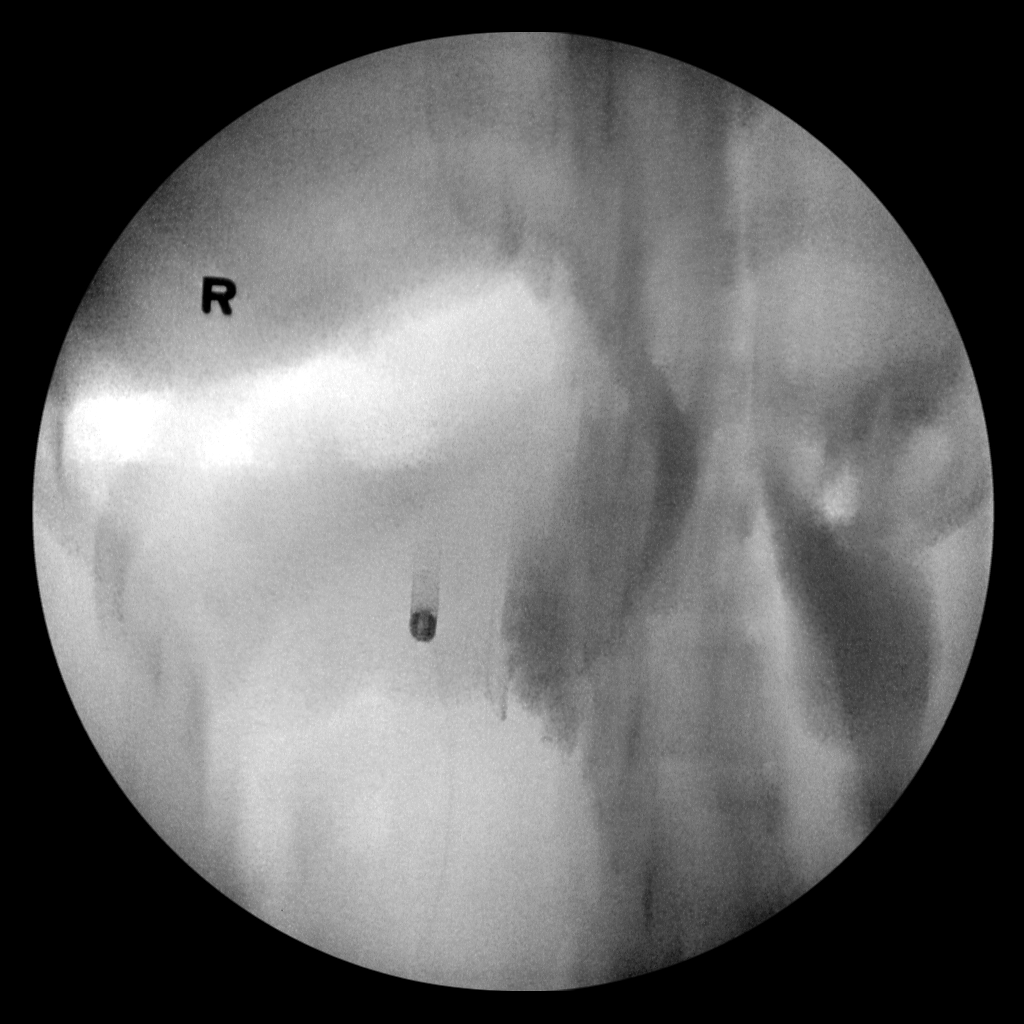
[frame 90/105]
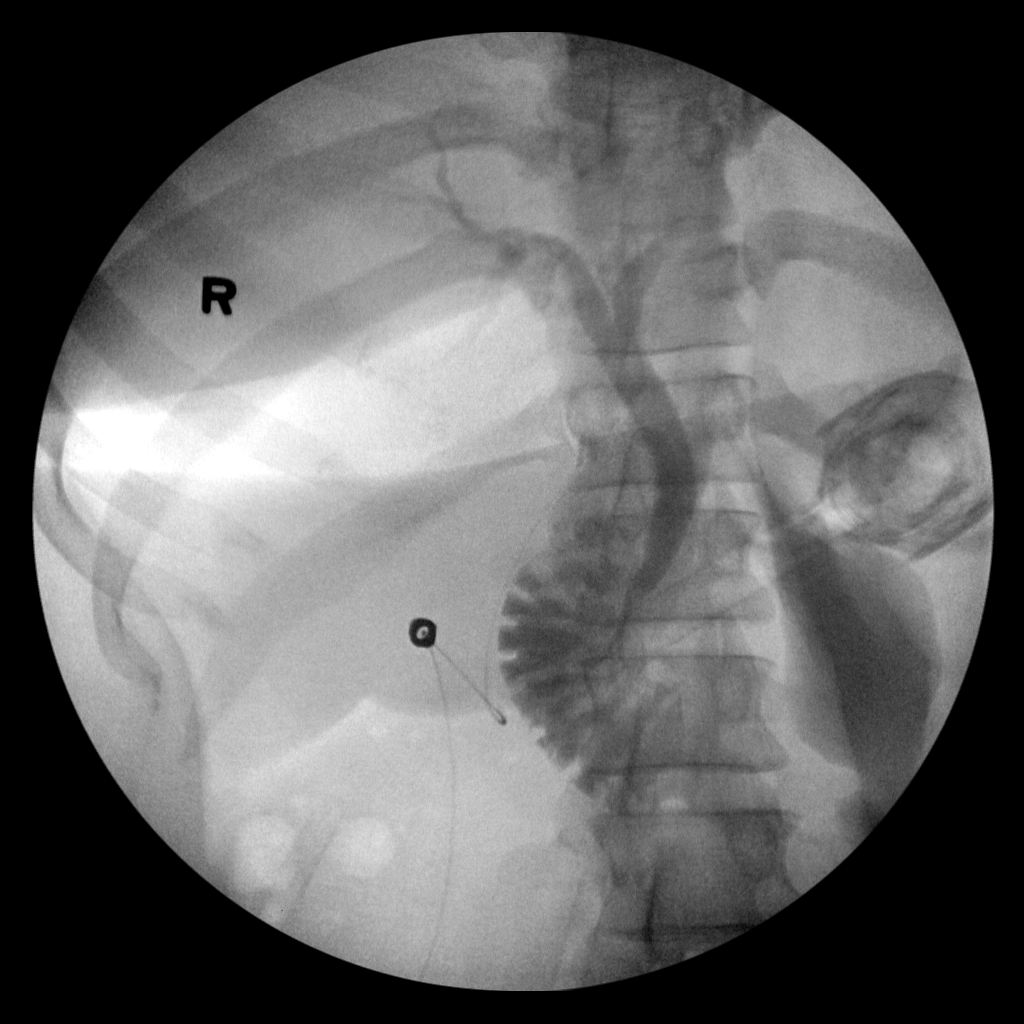

[4 of 4 positions shown; findings below may reference images not displayed]

FINDINGS: Intraoperative angiographic images of the right upper abdominal
quadrant during laparoscopic cholecystectomy are provided for
review.

Surgical clips overlie the expected location of the gallbladder
fossa.

Contrast injection demonstrates selective cannulation of the central
aspect of the cystic duct.

There is passage of contrast through the central aspect of the
cystic duct with filling of a non dilated common bile duct. There is
passage of contrast though the CBD and into the descending portion
of the duodenum.

There is minimal reflux of injected contrast into the common hepatic
duct and central aspect of the non dilated intrahepatic biliary
system.

There are no discrete filling defects within the opacified portions
of the biliary system to suggest the presence of
choledocholithiasis.
IMPRESSION: No evidence of choledocholithiasis.
